# Patient Record
Sex: Male | Born: 1970 | Race: White | Hispanic: No | Marital: Single | Smoking: Current every day smoker
Health system: Southern US, Community
[De-identification: ages and names within clinical notes are randomized; demographics above are authoritative.]

## PROBLEM LIST (undated history)

## (undated) HISTORY — PX: HERNIA REPAIR: SHX51

---

## 2007-02-21 ENCOUNTER — Emergency Department (HOSPITAL_COMMUNITY): Admission: EM | Admit: 2007-02-21 | Discharge: 2007-02-21 | Payer: Self-pay | Admitting: Emergency Medicine

## 2011-12-29 ENCOUNTER — Ambulatory Visit: Payer: Self-pay

## 2011-12-29 DIAGNOSIS — K602 Anal fissure, unspecified: Secondary | ICD-10-CM

## 2012-04-05 ENCOUNTER — Ambulatory Visit: Payer: Self-pay | Admitting: Internal Medicine

## 2012-04-05 VITALS — BP 116/84 | HR 98 | Temp 98.7°F | Resp 20 | Ht 69.0 in | Wt 198.0 lb

## 2012-04-05 DIAGNOSIS — J209 Acute bronchitis, unspecified: Secondary | ICD-10-CM

## 2012-04-05 DIAGNOSIS — F17200 Nicotine dependence, unspecified, uncomplicated: Secondary | ICD-10-CM

## 2012-04-05 DIAGNOSIS — Z720 Tobacco use: Secondary | ICD-10-CM | POA: Insufficient documentation

## 2012-04-05 MED ORDER — AZITHROMYCIN 500 MG PO TABS: 500.0000 mg | ORAL_TABLET | Freq: Every day | ORAL | Status: AC

## 2012-04-05 MED ORDER — ALBUTEROL SULFATE (2.5 MG/3ML) 0.083% IN NEBU: 2.5000 mg | INHALATION_SOLUTION | Freq: Four times a day (QID) | RESPIRATORY_TRACT | Status: DC | PRN

## 2012-04-05 MED ORDER — IPRATROPIUM BROMIDE 0.02 % IN SOLN: 0.5000 mg | Freq: Once | RESPIRATORY_TRACT | Status: AC

## 2012-04-05 MED ORDER — ALBUTEROL SULFATE (2.5 MG/3ML) 0.083% IN NEBU: 2.5000 mg | INHALATION_SOLUTION | Freq: Once | RESPIRATORY_TRACT | Status: AC

## 2012-04-05 NOTE — Progress Notes (Signed)
  Subjective:    Patient ID: Xavier Allen, male    DOB: [birth year], 41 y.o.   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  He has a headache and nasal congestion as well.  States he felt feverish last night.  He has no PCP and is generally healthy.  He has a male partner who is pregnant and is with him today.  He smokes about 1 PPD for 20 years.  Denies underlying known lung disease, diabetes, or any chronic illness.  He has no health insurance.    Review of Systems  All other systems reviewed and are negative.  Negative except as noted in HPI     Objective:   Physical Exam  Vitals reviewed. Constitutional: He is oriented to person, place, and time. He appears well-developed and well-nourished.       obese  HENT:  Head: Normocephalic and atraumatic.  Right Ear: External ear normal.  Mouth/Throat: Oropharynx is clear and moist. No oropharyngeal exudate.  Eyes: Conjunctivae are normal.  Neck: Normal range of motion.  Cardiovascular: Normal rate, regular rhythm and normal heart sounds.   Pulmonary/Chest: Effort normal. He has wheezes.       Inspiratory and expiratory wheezes prior to neb tx, after neb tx with albuterol and atrovent his O2 sat is 97 and his lungs have no wheezing or rhonchii  Abdominal: Soft.  Lymphadenopathy:    He has no cervical adenopathy.  Neurological: He is alert and oriented to person, place, and time.  Skin: Skin is warm and dry.  Psychiatric: He has a normal mood and affect. His behavior is normal.       Pt is anxious     After nebulizer treatment with albuterol and atrovent O2 sat up to 97%.     Assessment & Plan:  Bronchitis with Wheezing:  Azithromycin for 5 days and albuterol inhaler every 4 hours.  Ten minute discussion on smoking cessation aids.  Good time for him to stop with baby on the way.  He and wife agree.  RTC in 1-2 weeks for recheck if desires Wellbutrin or Chantix, pt told he  will need a more thorough screening at that time to be eligible to take those meds.  RTC if symptoms do not improve in 2 days or if symptoms worsen.  AVS printed and given pt.

## 2012-04-05 NOTE — Patient Instructions (Signed)
TAke all of your azithromycin for 5 days and use the albuterol inhaler as needed.  Return if your not improving in 2-3 days or if your symptoms worsen.  Consider stopping smoking!! Bronchitis Bronchitis is the body's way of reacting to injury and/or infection (inflammation) of the bronchi. Bronchi are the air tubes that extend from the windpipe into the lungs. If the inflammation becomes severe, it may cause shortness of breath. CAUSES  Inflammation may be caused by:  A virus.   Germs (bacteria).   Dust.   Allergens.   Pollutants and many other irritants.  The cells lining the bronchial tree are covered with tiny hairs (cilia). These constantly beat upward, away from the lungs, toward the mouth. This keeps the lungs free of pollutants. When these cells become too irritated and are unable to do their job, mucus begins to develop. This causes the characteristic cough of bronchitis. The cough clears the lungs when the cilia are unable to do their job. Without either of these protective mechanisms, the mucus would settle in the lungs. Then you would develop pneumonia. Smoking is a common cause of bronchitis and can contribute to pneumonia. Stopping this habit is the single most important thing you can do to help yourself. TREATMENT   Your caregiver may prescribe an antibiotic if the cough is caused by bacteria. Also, medicines that open up your airways make it easier to breathe. Your caregiver may also recommend or prescribe an expectorant. It will loosen the mucus to be coughed up. Only take over-the-counter or prescription medicines for pain, discomfort, or fever as directed by your caregiver.   Removing whatever causes the problem (smoking, for example) is critical to preventing the problem from getting worse.   Cough suppressants may be prescribed for relief of cough symptoms.   Inhaled medicines may be prescribed to help with symptoms now and to help prevent problems from returning.   For  those with recurrent (chronic) bronchitis, there may be a need for steroid medicines.  SEEK IMMEDIATE MEDICAL CARE IF:   During treatment, you develop more pus-like mucus (purulent sputum).   You have a fever.   Your baby is older than 3 months with a rectal temperature of 102 F (38.9 C) or higher.   Your baby is 66 months old or younger with a rectal temperature of 100.4 F (38 C) or higher.   You become progressively more ill.   You have increased difficulty breathing, wheezing, or shortness of breath.  It is necessary to seek immediate medical care if you are elderly or sick from any other disease. MAKE SURE YOU:   Understand these instructions.   Will watch your condition.   Will get help right away if you are not doing well or get worse.  Document Released: 12/16/2005 Document Revised: 12/05/2011 Document Reviewed: 10/25/2008 [facility] Patient Information 2012 [place], [place].

## 2012-05-11 ENCOUNTER — Ambulatory Visit (INDEPENDENT_AMBULATORY_CARE_PROVIDER_SITE_OTHER): Payer: Managed Care, Other (non HMO) | Admitting: Family Medicine

## 2012-05-11 VITALS — BP 105/74 | HR 73 | Temp 97.8°F | Resp 16 | Ht 67.75 in | Wt 194.6 lb

## 2012-05-11 DIAGNOSIS — L439 Lichen planus, unspecified: Secondary | ICD-10-CM

## 2012-05-11 DIAGNOSIS — J45909 Unspecified asthma, uncomplicated: Secondary | ICD-10-CM

## 2012-05-11 DIAGNOSIS — K439 Ventral hernia without obstruction or gangrene: Secondary | ICD-10-CM

## 2012-05-11 LAB — POCT SKIN KOH: Skin KOH, POC: NEGATIVE

## 2012-05-11 MED ORDER — TRIAMCINOLONE ACETONIDE 0.1 % EX CREA: TOPICAL_CREAM | Freq: Two times a day (BID) | CUTANEOUS | Status: AC

## 2012-05-11 MED ORDER — ALBUTEROL SULFATE HFA 108 (90 BASE) MCG/ACT IN AERS: 2.0000 | INHALATION_SPRAY | Freq: Four times a day (QID) | RESPIRATORY_TRACT | Status: DC | PRN

## 2012-05-11 MED ORDER — BENZONATATE 100 MG PO CAPS: 100.0000 mg | ORAL_CAPSULE | Freq: Three times a day (TID) | ORAL | Status: AC | PRN

## 2012-05-11 MED ORDER — AZITHROMYCIN 250 MG PO TABS: ORAL_TABLET | ORAL | Status: AC

## 2012-05-11 MED ORDER — PREDNISONE 20 MG PO TABS: ORAL_TABLET | ORAL | Status: AC

## 2012-05-11 NOTE — Progress Notes (Signed)
Addended by: Cheri Rous E on: 05/11/2012 02:50 PM   Modules accepted: Orders

## 2012-05-11 NOTE — Patient Instructions (Signed)
Use cream on penis for twice daily for 2 weeks. If rash is not doing better by 3 weeks we will send on to a dermatologist.  Is being made to [facility]  [place] [facility]. If you do not hear from [place] on that please contact us back.

## 2012-05-11 NOTE — Progress Notes (Signed)
Subjective: 41 year old white male with history of having a respiratory infection back in early April, was treated with Zithromax and inhaler. He improved. Then he get sick again. It seems to be just beginning worse. He is blowing out purulent mucus from his nose. He has a bad cough. The cough has aggravated his pre-existing ventral hernia. He is now concerned about that also. His third issue is that he has a red area on his penis. He felt like he had been irritated with having intercourse with his partner was dry. It is is continue to persist however and that has been of concern to him. Lesion on the penis is not painful. It seems to go around the pain is a little bit now. It started as just a red streak.  He is working hard on quitting smoking, has a plan in mind. He intends to be quit in 6 weeks. He has a newborn 3 day old baby at home, and rises that he needs to do things to make himself healthy her. Works as a Investment banker, operational.  Objective: No acute distress. TMs are normal. Throat clear. Neck supple without significant nodes. Chest has soft wheezing bilaterally. There is a prolonged expiratory phase. Heart regular without murmurs. Abdomen was soft without masses. Does have a moderate sized ventral hernia which bulges if he coughs or strains at all. There is a rash around the rim of the glans of his penis. This has a chronic, erythematous, shiny, mildly raised appearance to it. One area looks like it had a little excoriation to it or break in the skin, which is now healed up. Skin scraping was done. KOH examination did not reveal any yeast or fungus.  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 Dwain Sarna and associates at surgical surgery for evaluation of hernia. Advise weight loss and regular  exercise.  Triamcinolone cream to penis. If his getting no better we will refer to a dermatologist. He is to give it about 3 weeks and let me know.

## 2012-06-05 ENCOUNTER — Telehealth (INDEPENDENT_AMBULATORY_CARE_PROVIDER_SITE_OTHER): Payer: Self-pay | Admitting: General Surgery

## 2012-06-05 ENCOUNTER — Ambulatory Visit (INDEPENDENT_AMBULATORY_CARE_PROVIDER_SITE_OTHER): Payer: Managed Care, Other (non HMO) | Admitting: General Surgery

## 2012-06-05 NOTE — Telephone Encounter (Signed)
Let patient know that I rescheduled his apt for 6/10 at 9:20 with Dr. Mignon Pine.  I told him he needs to arrive 30 minutes before the apt time so we can get him through registration.

## 2012-06-08 ENCOUNTER — Encounter (INDEPENDENT_AMBULATORY_CARE_PROVIDER_SITE_OTHER): Payer: Self-pay | Admitting: Surgery

## 2012-06-08 ENCOUNTER — Ambulatory Visit (INDEPENDENT_AMBULATORY_CARE_PROVIDER_SITE_OTHER): Payer: Managed Care, Other (non HMO) | Admitting: Surgery

## 2012-06-08 VITALS — BP 128/82 | HR 80 | Temp 97.4°F | Resp 16 | Ht 69.0 in | Wt 196.0 lb

## 2012-06-08 DIAGNOSIS — R1900 Intra-abdominal and pelvic swelling, mass and lump, unspecified site: Secondary | ICD-10-CM

## 2012-06-08 NOTE — Patient Instructions (Signed)
Stop smoking.  You will be scheduled for a CT scan.  Weight loss. Return to clinic once testing done.

## 2012-06-08 NOTE — Progress Notes (Signed)
Patient ID: Xavier Allen, male   DOB: 04-09-1971, 41 y.o.   MRN: [id number]  Chief Complaint  Patient presents with  . Hernia    new pt- eval ventral hernia    HPI Xavier Allen is a 41 y.o. male.   HPIPatient sent at the request of Dr. Alwyn Ren for a  upper abdominal wall bulge. He has had this for a number of years. It is located in the middle between the umbilicus and sternum. The bulge is larger last few months due to increased coughing. He smokes one pack of cigarettes a day has done so for 15 years. He has a chronic cough. He denies any nausea or vomiting. There is some mild discomfort associated with the area but no severe pain.  History reviewed. No pertinent past medical history.  History reviewed. No pertinent past surgical history.  Family History  Problem Relation Age of Onset  . Heart disease Father     Social History History  Substance Use Topics  . Smoking status: Current Everyday Smoker -- 1.0 packs/day for 15 years    Types: Cigarettes  . Smokeless tobacco: Never Used   Comment: wants to try to quit   . Alcohol Use: Yes    No Known Allergies  Current Outpatient Prescriptions  Medication Sig Dispense Refill  . acetaminophen (TYLENOL) 500 MG tablet Take 500 mg by mouth every 6 (six) hours as needed.      Marland Kitchen albuterol (PROVENTIL HFA;VENTOLIN HFA) 108 (90 BASE) MCG/ACT inhaler Inhale 2 puffs into the lungs every 6 (six) hours as needed for wheezing.  1 Inhaler  0  . albuterol (PROVENTIL) (2.5 MG/3ML) 0.083% nebulizer solution Take 3 mLs (2.5 mg total) by nebulization every 6 (six) hours as needed for wheezing.  150 mL  1  . tiotropium (SPIRIVA) 18 MCG inhalation capsule Place 18 mcg into inhaler and inhale daily.      Marland Kitchen triamcinolone cream (KENALOG) 0.1 % Apply topically 2 (two) times daily.  30 g  0   Current Facility-Administered Medications  Medication Dose Route Frequency Provider Last Rate Last Dose  . albuterol (PROVENTIL) (2.5 MG/3ML) 0.083% nebulizer  solution 2.5 mg  2.5 mg Nebulization Once Rickard Patience, PA-C      . ipratropium (ATROVENT) nebulizer solution 0.5 mg  0.5 mg Nebulization Once Rickard Patience, PA-C        Review of Systems Review of Systems  Constitutional: Negative.   HENT: Negative.   Eyes: Negative.   Respiratory: Positive for cough and wheezing.   Cardiovascular: Negative.   Gastrointestinal: Negative.   Genitourinary: Negative.   Musculoskeletal: Negative.   Neurological: Negative.   Hematological: Negative.   Psychiatric/Behavioral: Negative.     Blood pressure 128/82, pulse 80, temperature 97.4 F (36.3 C), temperature source Temporal, resp. rate 16, height 5\' 9"  (1.753 m), weight 196 lb (88.905 kg).  Physical Exam Physical Exam  Constitutional: He is oriented to person, place, and time. He appears well-developed and well-nourished.  HENT:  Head: Normocephalic and atraumatic.  Eyes: EOM are normal. Pupils are equal, round, and reactive to light.  Neck: Normal range of motion. Neck supple.  Cardiovascular: Normal rate and regular rhythm.   Pulmonary/Chest: Effort normal and breath sounds normal. He has no wheezes. He has no rales.  Abdominal: Soft. Bowel sounds are normal.    Musculoskeletal: Normal range of motion.  Neurological: He is alert and oriented to person, place, and time.  Skin: Skin is warm and dry.  Psychiatric: He  has a normal mood and affect. His behavior is normal. Judgment and thought content normal.       Assessment    Abdominal wall bulge  Tobacco abuse  Chronic cough    Plan    Smoking cessation strongly encouraged  CT scan abdomen pelvis to better anatomy to help differentiate rectus diastases from ventral hernia    Return to clinic in 3 weeks.       Satcha Storlie A. 06/08/2012, 10:19 AM

## 2012-06-11 ENCOUNTER — Other Ambulatory Visit: Payer: Self-pay

## 2012-07-03 ENCOUNTER — Encounter (INDEPENDENT_AMBULATORY_CARE_PROVIDER_SITE_OTHER): Payer: Managed Care, Other (non HMO) | Admitting: Surgery

## 2013-11-11 ENCOUNTER — Encounter (INDEPENDENT_AMBULATORY_CARE_PROVIDER_SITE_OTHER): Payer: Managed Care, Other (non HMO) | Admitting: Surgery

## 2014-01-21 DIAGNOSIS — Z1211 Encounter for screening for malignant neoplasm of colon: Secondary | ICD-10-CM | POA: Insufficient documentation

## 2014-04-08 DIAGNOSIS — K469 Unspecified abdominal hernia without obstruction or gangrene: Secondary | ICD-10-CM | POA: Insufficient documentation

## 2014-04-08 DIAGNOSIS — F17210 Nicotine dependence, cigarettes, uncomplicated: Secondary | ICD-10-CM | POA: Insufficient documentation

## 2016-07-15 ENCOUNTER — Other Ambulatory Visit: Payer: Self-pay | Admitting: Internal Medicine

## 2016-07-15 DIAGNOSIS — T1490XA Injury, unspecified, initial encounter: Secondary | ICD-10-CM

## 2020-05-17 ENCOUNTER — Other Ambulatory Visit: Payer: Self-pay

## 2020-05-18 ENCOUNTER — Encounter: Payer: Self-pay | Admitting: Family Medicine

## 2020-05-18 ENCOUNTER — Ambulatory Visit (INDEPENDENT_AMBULATORY_CARE_PROVIDER_SITE_OTHER): Payer: Self-pay

## 2020-05-18 ENCOUNTER — Ambulatory Visit: Payer: Self-pay | Admitting: Family Medicine

## 2020-05-18 VITALS — BP 118/76 | HR 89 | Temp 97.3°F | Ht 68.0 in | Wt 186.6 lb

## 2020-05-18 DIAGNOSIS — M778 Other enthesopathies, not elsewhere classified: Secondary | ICD-10-CM

## 2020-05-18 DIAGNOSIS — R05 Cough: Secondary | ICD-10-CM

## 2020-05-18 DIAGNOSIS — M25551 Pain in right hip: Secondary | ICD-10-CM

## 2020-05-18 DIAGNOSIS — R059 Cough, unspecified: Secondary | ICD-10-CM

## 2020-05-18 DIAGNOSIS — Z720 Tobacco use: Secondary | ICD-10-CM

## 2020-05-18 DIAGNOSIS — F418 Other specified anxiety disorders: Secondary | ICD-10-CM

## 2020-05-18 MED ORDER — BUPROPION HCL 75 MG PO TABS
ORAL_TABLET | ORAL | 1 refills | Status: DC
Start: 2020-05-18 — End: 2022-03-01

## 2020-05-18 NOTE — Patient Instructions (Signed)
Steps to Quit Smoking Smoking tobacco is the leading cause of preventable death. It can affect almost every organ in the body. Smoking puts you and those around you at risk for developing many serious chronic diseases. Quitting smoking can be difficult, but it is one of the best things that you can do for your health. It is never too late to quit. How do I get ready to quit? When you decide to quit smoking, create a plan to help you succeed. Before you quit:  Pick a date to quit. Set a date within the next 2 weeks to give you time to prepare.  Write down the reasons why you are quitting. Keep this list in places where you will see it often.  Tell your family, friends, and co-workers that you are quitting. Support from your loved ones can make quitting easier.  Talk with your health care provider about your options for quitting smoking.  Find out what treatment options are covered by your health insurance.  Identify people, places, things, and activities that make you want to smoke (triggers). Avoid them. What first steps can I take to quit smoking?  Throw away all cigarettes at home, at work, and in your car.  Throw away smoking accessories, such as ashtrays and lighters.  Clean your car. Make sure to empty the ashtray.  Clean your home, including curtains and carpets. What strategies can I use to quit smoking? Talk with your health care provider about combining strategies, such as taking medicines while you are also receiving in-person counseling. Using these two strategies together makes you more likely to succeed in quitting than if you used either strategy on its own.  If you are pregnant or breastfeeding, talk with your health care provider about finding counseling or other support strategies to quit smoking. Do not take medicine to help you quit smoking unless your health care provider tells you to do so. To quit smoking: Quit right away  Quit smoking completely, instead of  gradually reducing how much you smoke over a period of time. Research shows that stopping smoking right away is more successful than gradually quitting.  Attend in-person counseling to help you build problem-solving skills. You are more likely to succeed in quitting if you attend counseling sessions regularly. Even short sessions of 10 minutes can be effective. Take medicine You may take medicines to help you quit smoking. Some medicines require a prescription and some you can purchase over-the-counter. Medicines may have nicotine in them to replace the nicotine in cigarettes. Medicines may:  Help to stop cravings.  Help to relieve withdrawal symptoms. Your health care provider may recommend:  Nicotine patches, gum, or lozenges.  Nicotine inhalers or sprays.  Non-nicotine medicine that is taken by mouth. Find resources Find resources and support systems that can help you to quit smoking and remain smoke-free after you quit. These resources are most helpful when you use them often. They include:  Online chats with a counselor.  Telephone quitlines.  Printed self-help materials.  Support groups or group counseling.  Text messaging programs.  Mobile phone apps or applications. Use apps that can help you stick to your quit plan by providing reminders, tips, and encouragement. There are many free apps for mobile devices as well as websites. Examples include Quit Guide from the CDC and smokefree.gov What things can I do to make it easier to quit?   Reach out to your family and friends for support and encouragement. Call telephone quitlines (1-800-QUIT-NOW), reach   out to support groups, or work with a Social worker for support.  Ask people who smoke to avoid smoking around you.  Avoid places that trigger you to smoke, such as bars, parties, or smoke-break areas at work.  Spend time with people who do not smoke.  Lessen the stress in your life. Stress can be a smoking trigger for some  people. To lessen stress, try: ? Exercising regularly. ? Doing deep-breathing exercises. ? Doing yoga. ? Meditating. ? Performing a body scan. This involves closing your eyes, scanning your body from head to toe, and noticing which parts of your body are particularly tense. Try to relax the muscles in those areas. How will I feel when I quit smoking? Day 1 to 3 weeks Within the first 24 hours of quitting smoking, you may start to feel withdrawal symptoms. These symptoms are usually most noticeable 2-3 days after quitting, but they usually do not last for more than 2-3 weeks. You may experience these symptoms:  Mood swings.  Restlessness, anxiety, or irritability.  Trouble concentrating.  Dizziness.  Strong cravings for sugary foods and nicotine.  Mild weight gain.  Constipation.  Nausea.  Coughing or a sore throat.  Changes in how the medicines that you take for unrelated issues work in your body.  Depression.  Trouble sleeping (insomnia). Week 3 and afterward After the first 2-3 weeks of quitting, you may start to notice more positive results, such as:  Improved sense of smell and taste.  Decreased coughing and sore throat.  Slower heart rate.  Lower blood pressure.  Clearer skin.  The ability to breathe more easily.  Fewer sick days. Quitting smoking can be very challenging. Do not get discouraged if you are not successful the first time. Some people need to make many attempts to quit before they achieve long-term success. Do your best to stick to your quit plan, and talk with your health care provider if you have any questions or concerns. Summary  Smoking tobacco is the leading cause of preventable death. Quitting smoking is one of the best things that you can do for your health.  When you decide to quit smoking, create a plan to help you succeed.  Quit smoking right away, not slowly over a period of time.  When you start quitting, seek help from your  health care provider, family, or friends. This information is not intended to replace advice given to you by your health care provider. Make sure you discuss any questions you have with your health care provider. Document Revised: 09/10/2019 Document Reviewed: 03/06/2019 Elsevier Patient Education  [place].  Mindfulness-Based Stress Reduction Mindfulness-based stress reduction (MBSR) is a program that helps people learn to practice mindfulness. Mindfulness is the practice of intentionally paying attention to the present moment. It can be learned and practiced through techniques such as education, breathing exercises, meditation, and yoga. MBSR includes several mindfulness techniques in one program. MBSR works best when you understand the treatment, are willing to try new things, and can commit to spending time practicing what you learn. MBSR training may include learning about:  How your emotions, thoughts, and reactions affect your body.  New ways to respond to things that cause negative thoughts to start (triggers).  How to notice your thoughts and let go of them.  Practicing awareness of everyday things that you normally do without thinking.  The techniques and goals of different types of meditation. What are the benefits of MBSR? MBSR can have many benefits, which  include helping you to:  Develop self-awareness. This refers to knowing and understanding yourself.  Learn skills and attitudes that help you to participate in your own health care.  Learn new ways to care for yourself.  Be more accepting about how things are, and let things go.  Be less judgmental and approach things with an open mind.  Be patient with yourself and trust yourself more. MBSR has also been shown to:  Reduce negative emotions, such as depression and anxiety.  Improve memory and focus.  Change how you sense and approach pain.  Boost your body's ability to fight infections.  Help you  connect better with other people.  Improve your sense of well-being. Follow these instructions at home:   Find a local in-person or online MBSR program.  Set aside some time regularly for mindfulness practice.  Find a mindfulness practice that works best for you. This may include one or more of the following: ? Meditation. Meditation involves focusing your mind on a certain thought or activity. ? Breathing awareness exercises. These help you to stay present by focusing on your breath. ? Body scan. For this practice, you lie down and pay attention to each part of your body from head to toe. You can identify tension and soreness and intentionally relax parts of your body. ? Yoga. Yoga involves stretching and breathing, and it can improve your ability to move and be flexible. It can also provide an experience of testing your body's limits, which can help you release stress. ? Mindful eating. This way of eating involves focusing on the taste, texture, color, and smell of each bite of food. Because this slows down eating and helps you feel full sooner, it can be an important part of a weight-loss plan.  Find a podcast or recording that provides guidance for breathing awareness, body scan, or meditation exercises. You can listen to these any time when you have a free moment to rest without distractions.  Follow your treatment plan as told by your health care provider. This may include taking regular medicines and making changes to your diet or lifestyle as recommended. How to practice mindfulness To do a basic awareness exercise:  Find a comfortable place to sit.  Pay attention to the present moment. Observe your thoughts, feelings, and surroundings just as they are.  Avoid placing judgment on yourself, your feelings, or your surroundings. Make note of any judgment that comes up, and let it go.  Your mind may wander, and that is okay. Make note of when your thoughts drift, and return your  attention to the present moment. To do basic mindfulness meditation:  Find a comfortable place to sit. This may include a stable chair or a firm floor cushion. ? Sit upright with your back straight. Let your arms fall next to your side with your hands resting on your legs. ? If sitting in a chair, rest your feet flat on the floor. ? If sitting on a cushion, cross your legs in front of you.  Keep your head in a neutral position with your chin dropped slightly. Relax your jaw and rest the tip of your tongue on the roof of your mouth. Drop your gaze to the floor. You can close your eyes if you like.  Breathe normally and pay attention to your breath. Feel the air moving in and out of your nose. Feel your belly expanding and relaxing with each breath.  Your mind may wander, and that is okay. Make note  of when your thoughts drift, and return your attention to your breath.  Avoid placing judgment on yourself, your feelings, or your surroundings. Make note of any judgment or feelings that come up, let them go, and bring your attention back to your breath.  When you are ready, lift your gaze or open your eyes. Pay attention to how your body feels after the meditation. Where to find more information You can find more information about MBSR from:  Your health care provider.  Community-based meditation centers or programs.  Programs offered near you. Summary  Mindfulness-based stress reduction (MBSR) is a program that teaches you how to intentionally pay attention to the present moment. It is used with other treatments to help you cope better with daily stress, emotions, and pain.  MBSR focuses on developing self-awareness, which allows you to respond to life stress without judgment or negative emotions.  MBSR programs may involve learning different mindfulness practices, such as breathing exercises, meditation, yoga, body scan, or mindful eating. Find a mindfulness practice that works best for  you, and set aside time for it on a regular basis. This information is not intended to replace advice given to you by your health care provider. Make sure you discuss any questions you have with your health care provider. Document Revised: 11/28/2017 Document Reviewed: 04/24/2017 Elsevier Patient Education  2020 [employer].  Health Risks of Smoking Smoking cigarettes is very bad for your health. Tobacco smoke has over 200 known poisons in it. It contains the poisonous gases nitrogen oxide and carbon monoxide. There are over 60 chemicals in tobacco smoke that cause cancer. Smoking is difficult to quit because a chemical in tobacco, called nicotine, causes addiction or dependence. When you smoke and inhale, nicotine is absorbed rapidly into the bloodstream through your lungs. Both inhaled and non-inhaled nicotine may be addictive. What are the risks of cigarette smoke? Cigarette smokers have an increased risk of many serious medical problems, including:  Lung cancer.  Lung disease, such as pneumonia, bronchitis, and emphysema.  Chest pain (angina) and heart attack because the heart is not getting enough oxygen.  Heart disease and peripheral blood vessel disease.  High blood pressure (hypertension).  Stroke.  Oral cancer, including cancer of the lip, mouth, or voice box.  Bladder cancer.  Pancreatic cancer.  Cervical cancer.  Pregnancy complications, including premature birth.  Stillbirths and smaller newborn babies, birth defects, and genetic damage to sperm.  Early menopause.  Lower estrogen level for women.  Infertility.  Facial wrinkles.  Blindness.  Increased risk of broken bones (fractures).  Senile dementia.  Stomach ulcers and internal bleeding.  Delayed wound healing and increased risk of complications during surgery.  Even smoking lightly shortens your life expectancy by several years. Because of secondhand smoke exposure, children of smokers have an  increased risk of the following:  Sudden infant death syndrome (SIDS).  Respiratory infections.  Lung cancer.  Heart disease.  Ear infections. What are the benefits of quitting? There are many health benefits of quitting smoking. Here are some of them:  Within days of quitting smoking, your risk of having a heart attack decreases, your blood flow improves, and your lung capacity improves. Blood pressure, pulse rate, and breathing patterns start returning to normal soon after quitting.  Within months, your lungs may clear up completely.  Quitting for 10 years reduces your risk of developing lung cancer and heart disease to almost that of a nonsmoker.  People who quit may see an improvement in  their overall quality of life. How do I quit smoking?     Smoking is an addiction with both physical and psychological effects, and longtime habits can be hard to change. Your health care provider can recommend:  Programs and community resources, which may include group support, education, or talk therapy.  Prescription medicines to help reduce cravings.  Nicotine replacement products, such as patches, gum, and nasal sprays. Use these products only as directed. Do not replace cigarette smoking with electronic cigarettes, which are commonly called e-cigarettes. The safety of e-cigarettes is not known, and some may contain harmful chemicals.  A combination of two or more of these methods. Where to find more information  American Lung Association: www.lung.org  American Cancer Society: www.cancer.org Summary  Smoking cigarettes is very bad for your health. Cigarette smokers have an increased risk of many serious medical problems, including several cancers, heart disease, and stroke.  Smoking is an addiction with both physical and psychological effects, and longtime habits can be hard to change.  By stopping right away, you can greatly reduce the risk of medical problems for you and your  family.  To help you quit smoking, your health care provider can recommend programs, community resources, prescription medicines, and nicotine replacement products such as patches, gum, and nasal sprays. This information is not intended to replace advice given to you by your health care provider. Make sure you discuss any questions you have with your health care provider. Document Revised: 03/19/2018 Document Reviewed: 12/20/2016 Elsevier Patient Education  2020 [employer].

## 2020-05-18 NOTE — Progress Notes (Signed)
New Patient Office Visit  Subjective:  Patient ID: Xavier Allen, male    DOB: [birth year]  Age: 49 y.o. MRN: [id number]  CC:  Chief Complaint  Patient presents with  . Establish Care    New patient, pain in left palm of hand x 6 months, hip pain pt would like to discuss quitting smoking    HPI Xavier Allen presents for evaluation treatment of multiple medical issues and problems.  He has had a dry cough without fevers chills or significant weight loss that is ongoing for him.  He has no history of asthma.  He is a smoker.  He has smoked roughly 30 years averaging about a pack a day.  He has been having pain in the palm along one of the tendons of his fingers in his left hand.  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  Is also to have a cough for.  Denies sticking with the finger.  There is been no known injury.  Complains of pain in his right hip.  Pain is in the groin area and sometimes hurts him when he walks.  There is pain sometimes with abduction of his leg.  No injury here.  History reviewed. No pertinent past medical history.  Past Surgical History:  Procedure Laterality Date  . HERNIA REPAIR      Family History  Problem Relation Age of Onset  . Stroke Mother   . Diabetes Mother   . Heart disease Father   . Stroke Father     Social History   Socioeconomic History  . Marital status: Single    Spouse name: Not on file  . Number of children: Not on file  . Years of education: Not on file  . Highest education level: Not on file  Occupational History  . Not on file  Tobacco Use  . Smoking status: Current Every Day Smoker    Packs/day: 1.00    Years: 15.00    Pack years: 15.00    Types: Cigarettes  . Smokeless tobacco: Never Used  . Tobacco comment: wants to try to quit   Substance and Sexual Activity  . Alcohol use: Yes    Comment: drinks all not everyday  . Drug use: No  . Sexual activity: Yes  Other Topics Concern  .  Not on file  Social History Narrative  . Not on file   Social Determinants of Health   Financial Resource Strain:   . Difficulty of Paying Living Expenses:   Food Insecurity:   . Worried About Charity fundraiser in the Last Year:   . Arboriculturist in the Last Year:   Transportation Needs:   . Film/video editor (Medical):   Marland Kitchen Lack of Transportation (Non-Medical):   Physical Activity:   . Days of Exercise per Week:   . Minutes of Exercise per Session:   Stress:   . Feeling of Stress :   Social Connections:   . Frequency of Communication with Friends and Family:   . Frequency of Social Gatherings with Friends and Family:   . Attends Religious Services:   . Active Member of Clubs or Organizations:   . Attends Archivist Meetings:   Marland Kitchen Marital Status:   Intimate Partner Violence:   . Fear of Current or Ex-Partner:   . Emotionally Abused:   Marland Kitchen Physically Abused:   . Sexually Abused:     ROS Review of Systems  Constitutional: Negative for chills, diaphoresis, fatigue, fever and unexpected weight change.  HENT: Negative.   Eyes: Negative for photophobia and visual disturbance.  Respiratory: Positive for cough. Negative for choking, chest tightness, shortness of breath and wheezing.   Cardiovascular: Negative.   Gastrointestinal: Negative.   Endocrine: Negative for polyphagia and polyuria.  Genitourinary: Negative.   Musculoskeletal: Positive for arthralgias. Negative for back pain and gait problem.  Skin: Negative for pallor and rash.  Allergic/Immunologic: Negative for immunocompromised state.  Neurological: Negative for weakness.  Hematological: Does not bruise/bleed easily.  Psychiatric/Behavioral: Positive for decreased concentration and dysphoric mood. The patient is nervous/anxious.     Objective:   Today's Vitals: BP 118/76   Pulse 89   Temp (!) 97.3 F (36.3 C) (Tympanic)   Ht 5\' 8"  (1.727 m)   Wt 186 lb 9.6 oz (84.6 kg)   SpO2 96%   BMI  28.37 kg/m   Physical Exam Vitals and nursing note reviewed.  Constitutional:      General: He is not in acute distress.    Appearance: Normal appearance. He is normal weight. He is not ill-appearing, toxic-appearing or diaphoretic.  HENT:     Head: Normocephalic and atraumatic.     Right Ear: External ear normal.     Left Ear: External ear normal.  Eyes:     General: No scleral icterus.       Right eye: No discharge.        Left eye: No discharge.     Conjunctiva/sclera: Conjunctivae normal.     Pupils: Pupils are equal, round, and reactive to light.  Cardiovascular:     Rate and Rhythm: Normal rate and regular rhythm.  Pulmonary:     Effort: Pulmonary effort is normal. No respiratory distress.     Breath sounds: Normal breath sounds. No stridor. No wheezing, rhonchi or rales.  Abdominal:     General: Bowel sounds are normal. There is no distension.     Palpations: There is no mass.     Tenderness: There is no abdominal tenderness. There is no guarding or rebound.     Hernia: No hernia is present.  Musculoskeletal:     Left hand: Tenderness present. No swelling, deformity or lacerations. Normal range of motion. Normal strength.       Arms:     Cervical back: No rigidity or tenderness.     Lumbar back: No swelling, deformity, lacerations, spasms, tenderness or bony tenderness. Normal range of motion.     Right hip: No tenderness or bony tenderness. Normal range of motion. Normal strength.     Left hip: No tenderness or bony tenderness. Normal range of motion. Normal strength.  Lymphadenopathy:     Cervical: No cervical adenopathy.  Neurological:     Mental Status: He is alert and oriented to person, place, and time.     Motor: Motor function is intact. No weakness.  Psychiatric:        Mood and Affect: Mood normal.        Behavior: Behavior normal.     Assessment & Plan:   Problem List Items Addressed This Visit    None    Visit Diagnoses    Flexor tendinitis of  hand    -  Primary   Relevant Orders   Ambulatory referral to Sports Medicine   Hip pain, right       Relevant Orders   Ambulatory referral to Sports Medicine   DG Hip Unilat W OR W/O  Pelvis 2-3 Views Right   Tobacco abuse       Relevant Medications   buPROPion (WELLBUTRIN) 75 MG tablet   Depression with anxiety       Relevant Medications   buPROPion (WELLBUTRIN) 75 MG tablet   Cough       Relevant Orders   DG Chest 2 View      Outpatient Encounter Medications as of 05/18/2020  Medication Sig  . acetaminophen (TYLENOL) 500 MG tablet Take 500 mg by mouth every 6 (six) hours as needed.  Marland Kitchen albuterol (PROVENTIL HFA;VENTOLIN HFA) 108 (90 BASE) MCG/ACT inhaler Inhale 2 puffs into the lungs every 6 (six) hours as needed for wheezing. (Patient not taking: Reported on 05/18/2020)  . albuterol (PROVENTIL) (2.5 MG/3ML) 0.083% nebulizer solution Take 3 mLs (2.5 mg total) by nebulization every 6 (six) hours as needed for wheezing.  Marland Kitchen buPROPion (WELLBUTRIN) 75 MG tablet Take each morning for one week and then increase twice daily.  Marland Kitchen tiotropium (SPIRIVA) 18 MCG inhalation capsule Place 18 mcg into inhaler and inhale daily.   Facility-Administered Encounter Medications as of 05/18/2020  Medication  . albuterol (PROVENTIL) (2.5 MG/3ML) 0.083% nebulizer solution 2.5 mg  . ipratropium (ATROVENT) nebulizer solution 0.5 mg    Follow-up: Return in about 5 weeks (around 06/22/2020).   Patient was given information on steps to quit smoking as well as mindfulness based stress reduction.  We will start Wellbutrin.  Hopefully this will help his depression and anxiety as well as help him quit smoking.  It is worked in the past for him.  Sports medicine referral for evaluation of right hip pain and possible ganglion cyst.  Advised to quit smoking.  Physical exam in the near future. Mliss Sax, MD

## 2020-05-22 ENCOUNTER — Ambulatory Visit: Payer: Self-pay | Admitting: Family Medicine

## 2020-05-22 NOTE — Progress Notes (Deleted)
  Xavier Allen - 49 y.o. male MRN [id number]  Date of birth: [birth year]  SUBJECTIVE:  Including CC & ROS.  No chief complaint on file.   Xavier Allen is a 49 y.o. male that is  ***.  ***   Review of Systems See HPI   HISTORY: Past Medical, Surgical, Social, and Family History Reviewed & Updated per EMR.   Pertinent Historical Findings include:  No past medical history on file.  Past Surgical History:  Procedure Laterality Date  . HERNIA REPAIR      Family History  Problem Relation Age of Onset  . Stroke Mother   . Diabetes Mother   . Heart disease Father   . Stroke Father     Social History   Socioeconomic History  . Marital status: Single    Spouse name: Not on file  . Number of children: Not on file  . Years of education: Not on file  . Highest education level: Not on file  Occupational History  . Not on file  Tobacco Use  . Smoking status: Current Every Day Smoker    Packs/day: 1.00    Years: 15.00    Pack years: 15.00    Types: Cigarettes  . Smokeless tobacco: Never Used  . Tobacco comment: wants to try to quit   Substance and Sexual Activity  . Alcohol use: Yes    Comment: drinks all not everyday  . Drug use: No  . Sexual activity: Yes  Other Topics Concern  . Not on file  Social History Narrative  . Not on file   Social Determinants of Health   Financial Resource Strain:   . Difficulty of Paying Living Expenses:   Food Insecurity:   . Worried About Programme researcher, broadcasting/film/video in the Last Year:   . Barista in the Last Year:   Transportation Needs:   . Freight forwarder (Medical):   Marland Kitchen Lack of Transportation (Non-Medical):   Physical Activity:   . Days of Exercise per Week:   . Minutes of Exercise per Session:   Stress:   . Feeling of Stress :   Social Connections:   . Frequency of Communication with Friends and Family:   . Frequency of Social Gatherings with Friends and Family:   . Attends Religious Services:   . Active Member  of Clubs or Organizations:   . Attends Banker Meetings:   Marland Kitchen Marital Status:   Intimate Partner Violence:   . Fear of Current or Ex-Partner:   . Emotionally Abused:   Marland Kitchen Physically Abused:   . Sexually Abused:      PHYSICAL EXAM:  VS: There were no vitals taken for this visit. Physical Exam Gen: NAD, alert, cooperative with exam, well-appearing MSK:  ***      ASSESSMENT & PLAN:   No problem-specific Assessment & Plan notes found for this encounter.

## 2020-05-31 ENCOUNTER — Telehealth: Payer: Self-pay

## 2020-05-31 NOTE — Telephone Encounter (Signed)
Patient is needing the number to call to schedule with Dr. Hulen Shouts is aware he will get a call back with this information/plz advise/thx dmf

## 2020-06-01 NOTE — Telephone Encounter (Signed)
Number left on patients VM

## 2020-10-26 IMAGING — DX DG HIP (WITH OR WITHOUT PELVIS) 2-3V*R*
2 series · 2 of 2 positions shown · non-contrast
Comparison: No prior.

CLINICAL DATA: Right hip pain.

EXAM:
DG HIP (WITH OR WITHOUT PELVIS) 2-3V RIGHT

[pelvis ap]
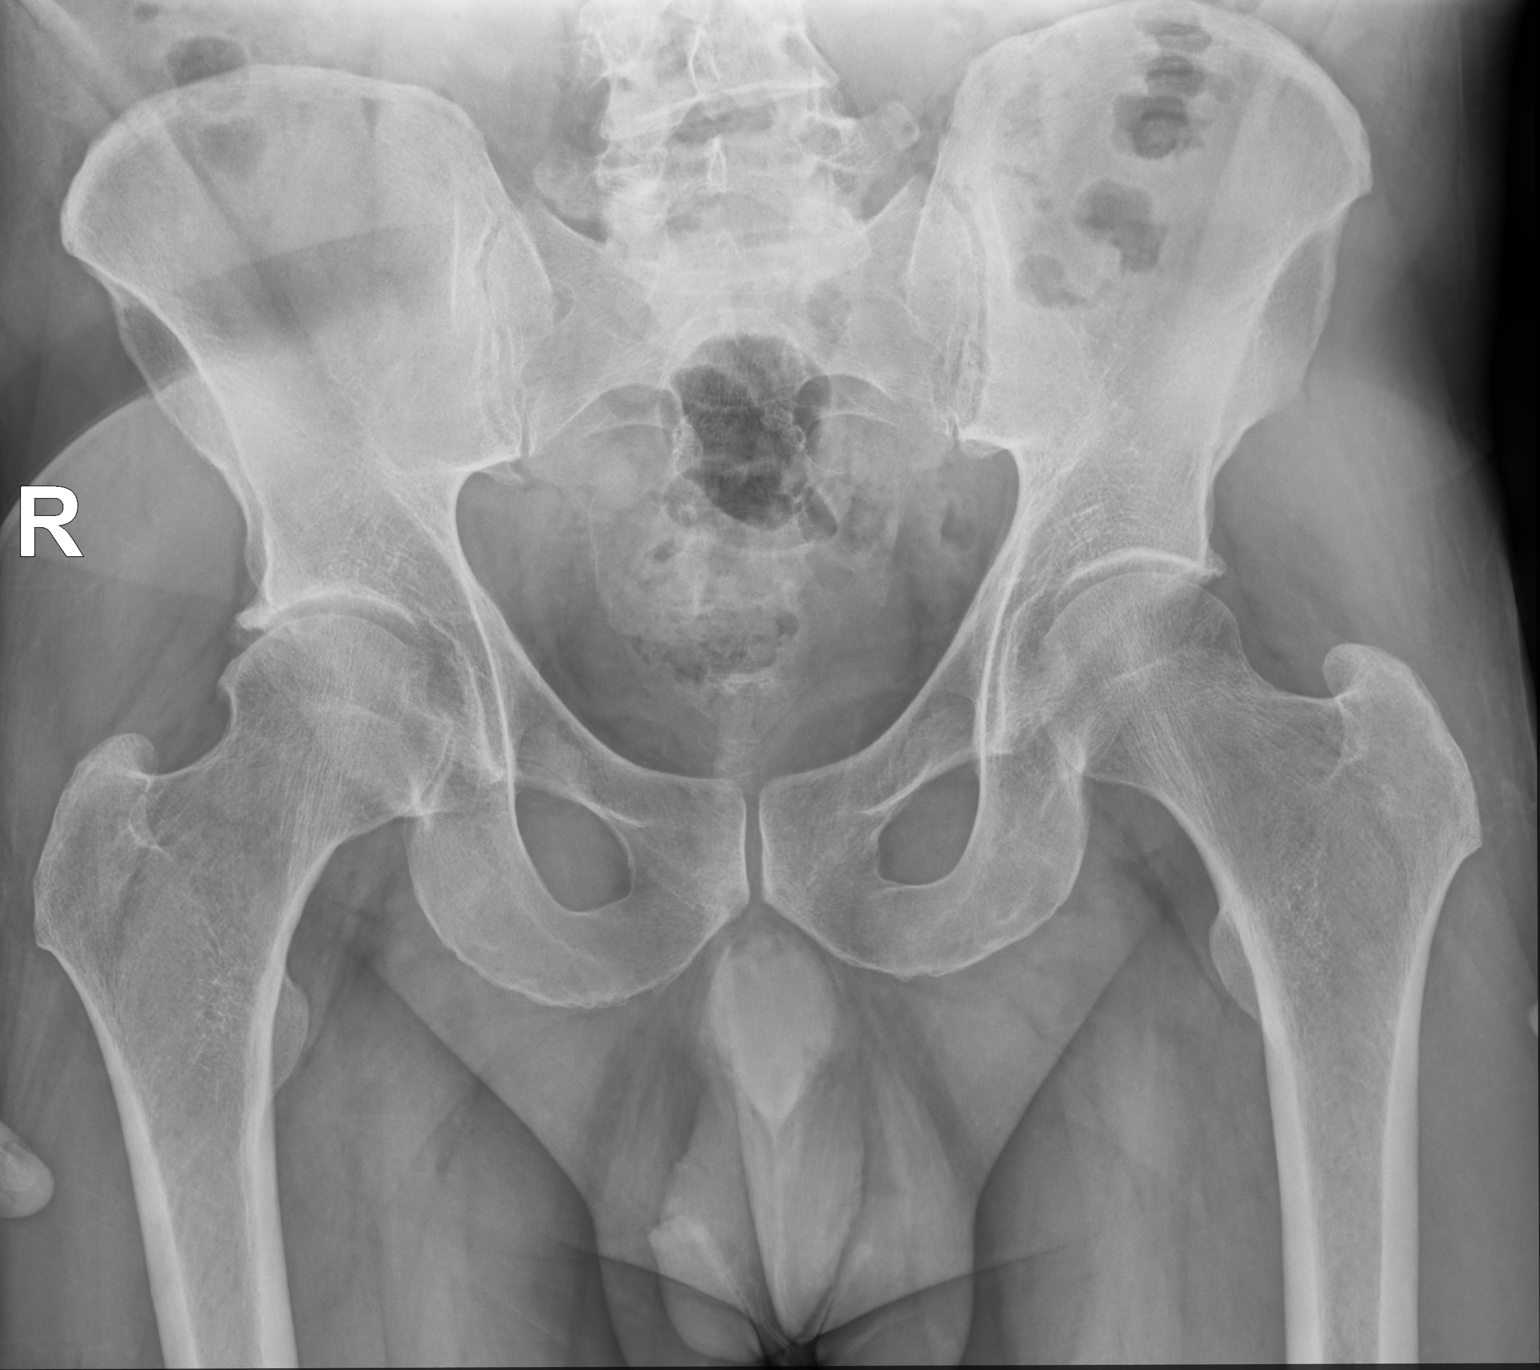

[hip frog leg lat]
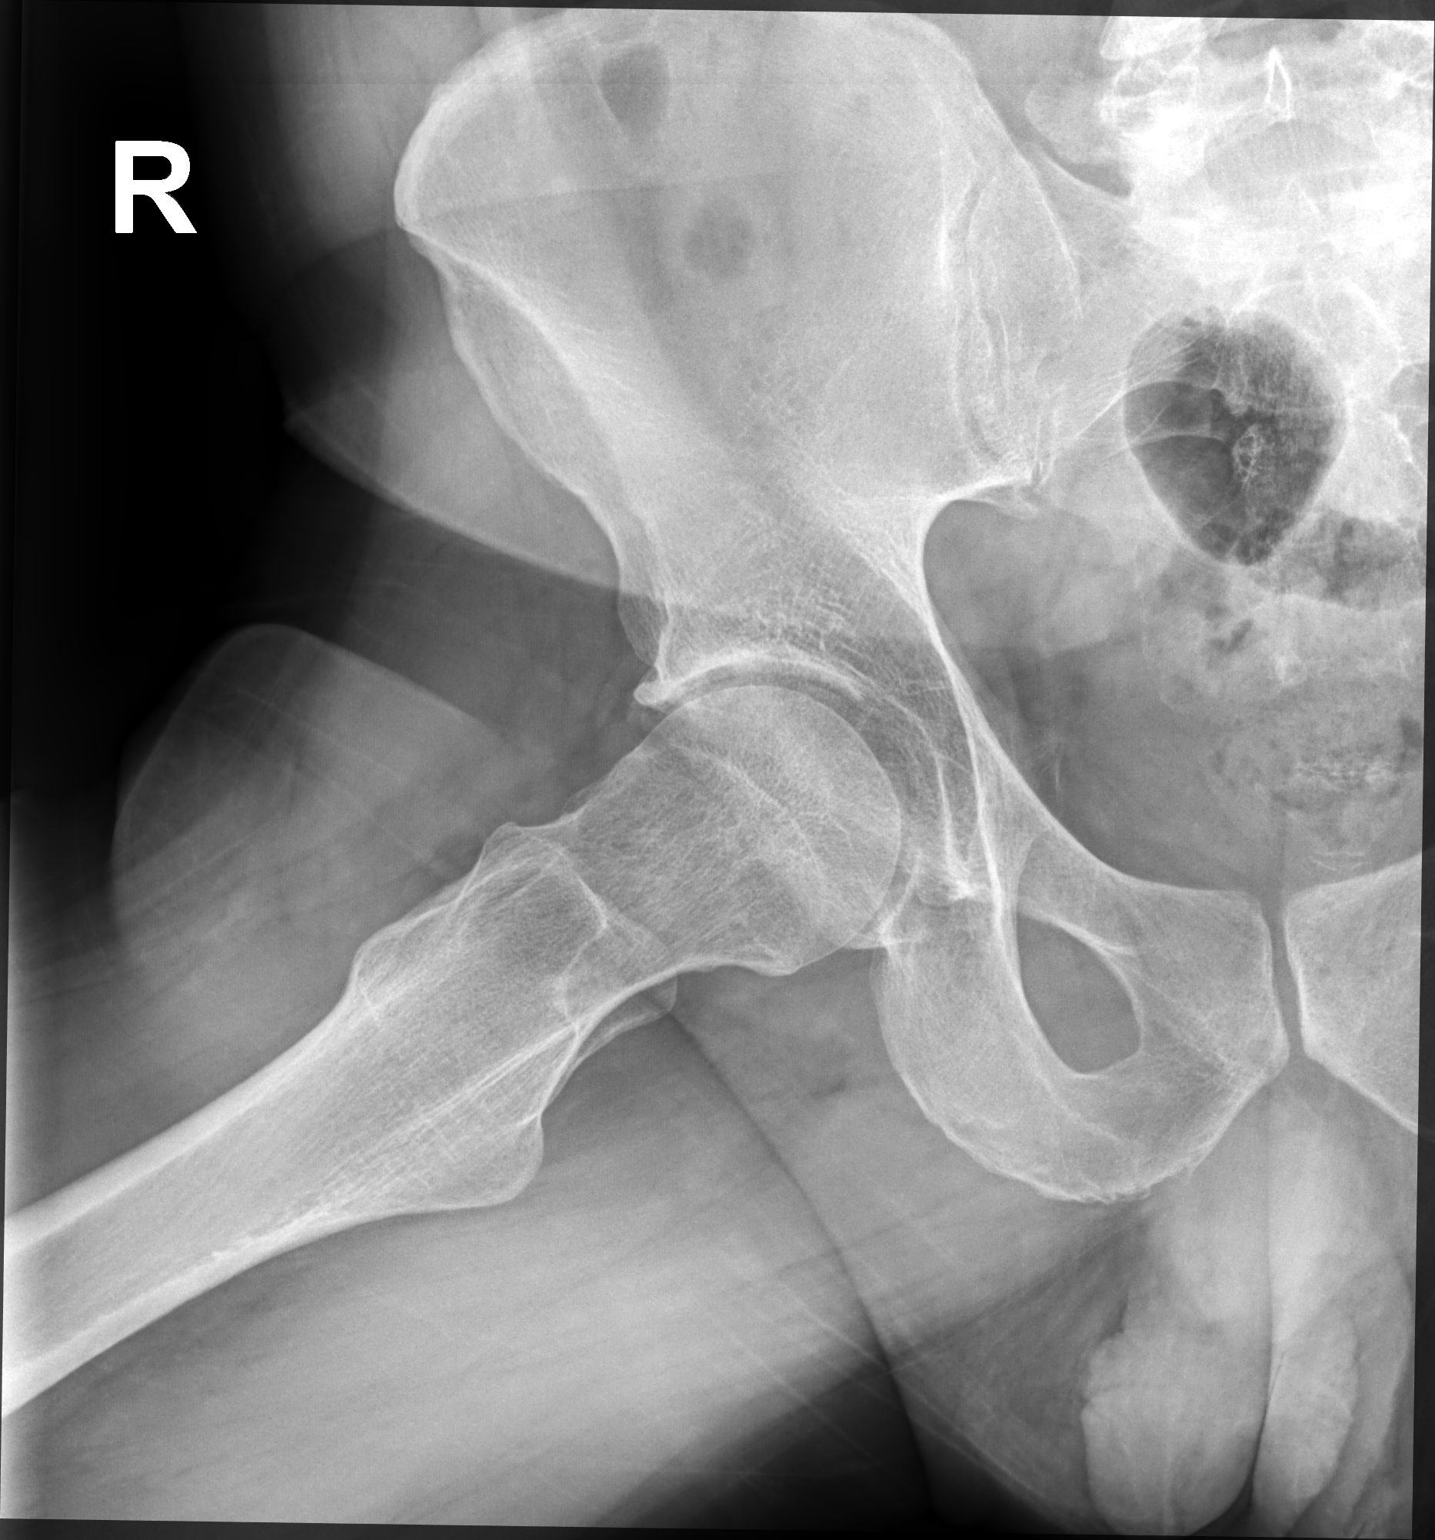

[2 of 2 positions shown; findings below may reference images not displayed]

FINDINGS: Degenerative changes lumbar spine and both hips. No acute bony or
joint abnormality. No evidence of fracture dislocation.
IMPRESSION: Degenerative changes lumbar spine and both hips. No acute bony
abnormality identified.

## 2022-03-01 ENCOUNTER — Ambulatory Visit (INDEPENDENT_AMBULATORY_CARE_PROVIDER_SITE_OTHER): Payer: Self-pay | Admitting: Family Medicine

## 2022-03-01 ENCOUNTER — Other Ambulatory Visit: Payer: Self-pay

## 2022-03-01 ENCOUNTER — Encounter: Payer: Self-pay | Admitting: Family Medicine

## 2022-03-01 VITALS — BP 122/74 | HR 96 | Temp 97.6°F | Ht 68.0 in | Wt 189.0 lb

## 2022-03-01 DIAGNOSIS — Z1211 Encounter for screening for malignant neoplasm of colon: Secondary | ICD-10-CM

## 2022-03-01 DIAGNOSIS — Z720 Tobacco use: Secondary | ICD-10-CM

## 2022-03-01 DIAGNOSIS — J22 Unspecified acute lower respiratory infection: Secondary | ICD-10-CM | POA: Insufficient documentation

## 2022-03-01 DIAGNOSIS — F418 Other specified anxiety disorders: Secondary | ICD-10-CM | POA: Insufficient documentation

## 2022-03-01 MED ORDER — BENZONATATE 200 MG PO CAPS: 200.0000 mg | ORAL_CAPSULE | Freq: Two times a day (BID) | ORAL | 0 refills | Status: AC | PRN

## 2022-03-01 MED ORDER — DOXYCYCLINE HYCLATE 100 MG PO TABS: 100.0000 mg | ORAL_TABLET | Freq: Two times a day (BID) | ORAL | 0 refills | Status: AC

## 2022-03-01 MED ORDER — BUPROPION HCL ER (XL) 150 MG PO TB24: 150.0000 mg | ORAL_TABLET | Freq: Every day | ORAL | 2 refills | Status: DC

## 2022-03-01 NOTE — Progress Notes (Signed)
On today Mr. Vertell Limber going to get the blood try cast you know the COVID-19 flu and RSV ? ?Established Patient Office Visit ? ?Subjective:  ?Patient ID: Xavier Allen, male    DOB: 06-11-1971  Age: 51 y.o. MRN: [id number] ? ?CC:  ?Chief Complaint  ?Patient presents with  ? Cough  ?  Cough x 1 week would like a refill on Wellbutrin.   ? ? ?HPI ?Feliciana Rossetti presents for 1 week history of cough productive of yellow phlegm.  Denies wheezing or tightness in his chest.  There have been no fevers and chills.  Denies sore throat.  Denies nasal congestion postnasal drip.  Denies mild myalgias or arthralgias.  Bupropion was helpful.  He did change the taste of his cigarettes.  He was able to stop drinking without much of a problem.  Continues to smoke.  Bupropion also helped him to focus.  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  He is busy and stressed. ? ?No past medical history on file. ? ?Past Surgical History:  ?Procedure Laterality Date  ? HERNIA REPAIR    ? ? ?Family History  ?Problem Relation Age of Onset  ? Stroke Mother   ? Diabetes Mother   ? Heart disease Father   ? Stroke Father   ? ? ?Social History  ? ?Socioeconomic History  ? Marital status: Single  ?  Spouse name: Not on file  ? Number of children: Not on file  ? Years of education: Not on file  ? Highest education level: Not on file  ?Occupational History  ? Not on file  ?Tobacco Use  ? Smoking status: Every Day  ?  Packs/day: 1.00  ?  Years: 15.00  ?  Pack years: 15.00  ?  Types: Cigarettes  ? Smokeless tobacco: Never  ? Tobacco comments:  ?  wants to try to quit   ?Vaping Use  ? Vaping Use: Never used  ?Substance and Sexual Activity  ? Alcohol use: Yes  ?  Comment: drinks all not everyday  ? Drug use: No  ? Sexual activity: Yes  ?Other Topics Concern  ? Not on file  ?Social History Narrative  ? Not on file  ? ?Social Determinants of Health  ? ?Financial Resource Strain: Not on file  ?Food Insecurity: Not on file  ?Transportation Needs: Not on file   ?Physical Activity: Not on file  ?Stress: Not on file  ?Social Connections: Not on file  ?Intimate Partner Violence: Not on file  ? ? ?Outpatient Medications Prior to Visit  ?Medication Sig Dispense Refill  ? tiotropium (SPIRIVA) 18 MCG inhalation capsule Place 18 mcg into inhaler and inhale daily.    ? acetaminophen (TYLENOL) 500 MG tablet Take 500 mg by mouth every 6 (six) hours as needed. (Patient not taking: Reported on 03/01/2022)    ? albuterol (PROVENTIL HFA;VENTOLIN HFA) 108 (90 BASE) MCG/ACT inhaler Inhale 2 puffs into the lungs every 6 (six) hours as needed for wheezing. (Patient not taking: Reported on 05/18/2020) 1 Inhaler 0  ? albuterol (PROVENTIL) (2.5 MG/3ML) 0.083% nebulizer solution Take 3 mLs (2.5 mg total) by nebulization every 6 (six) hours as needed for wheezing. 150 mL 1  ? buPROPion (WELLBUTRIN) 75 MG tablet Take each morning for one week and then increase twice daily. (Patient not taking: Reported on 03/01/2022) 60 tablet 1  ? ?Facility-Administered Medications Prior to Visit  ?Medication Dose Route Frequency Provider Last Rate Last Admin  ? albuterol (PROVENTIL) (2.5 MG/3ML) 0.083% nebulizer  solution 2.5 mg  2.5 mg Nebulization Once Kemper Durie, PA-C      ? ipratropium (ATROVENT) nebulizer solution 0.5 mg  0.5 mg Nebulization Once Kemper Durie, PA-C      ? ? ?No Known Allergies ? ?ROS ?Review of Systems  ?Constitutional:  Negative for chills, diaphoresis, fatigue, fever and unexpected weight change.  ?HENT:  Positive for congestion. Negative for postnasal drip, rhinorrhea and sore throat.   ?Eyes:  Negative for photophobia and visual disturbance.  ?Respiratory:  Positive for cough. Negative for chest tightness, shortness of breath and wheezing.   ?Cardiovascular: Negative.   ?Gastrointestinal:  Negative for nausea and vomiting.  ?Musculoskeletal:  Negative for arthralgias and myalgias.  ?Neurological:  Negative for speech difficulty, weakness and headaches.  ?Hematological:  Does not  bruise/bleed easily.  ?Psychiatric/Behavioral: Negative.    ? ?  ?Objective:  ?  ?Physical Exam ?Vitals and nursing note reviewed.  ?Constitutional:   ?   General: He is not in acute distress. ?   Appearance: Normal appearance. He is not ill-appearing, toxic-appearing or diaphoretic.  ?HENT:  ?   Head: Normocephalic and atraumatic.  ?   Right Ear: External ear normal.  ?   Left Ear: External ear normal.  ?   Mouth/Throat:  ?   Mouth: Mucous membranes are moist.  ?   Pharynx: Oropharynx is clear. No oropharyngeal exudate or posterior oropharyngeal erythema.  ?Eyes:  ?   General: No scleral icterus.    ?   Right eye: No discharge.     ?   Left eye: No discharge.  ?   Extraocular Movements: Extraocular movements intact.  ?   Conjunctiva/sclera: Conjunctivae normal.  ?   Pupils: Pupils are equal, round, and reactive to light.  ?Cardiovascular:  ?   Rate and Rhythm: Normal rate and regular rhythm.  ?Pulmonary:  ?   Effort: Pulmonary effort is normal. No respiratory distress.  ?   Breath sounds: Normal breath sounds. No wheezing, rhonchi or rales.  ?Abdominal:  ?   General: Abdomen is flat.  ?   Palpations: Abdomen is soft.  ?Musculoskeletal:  ?   Cervical back: No rigidity or tenderness.  ?Lymphadenopathy:  ?   Cervical: No cervical adenopathy.  ?Skin: ?   General: Skin is warm and dry.  ?Neurological:  ?   Mental Status: He is alert and oriented to person, place, and time.  ?Psychiatric:     ?   Mood and Affect: Mood normal.     ?   Behavior: Behavior normal.  ? ? ?BP 122/74 (BP Location: Right Leg, Patient Position: Sitting, Cuff Size: Large)   Pulse 96   Temp 97.6 ?F (36.4 ?C) (Temporal)   Ht 5' 8"  (1.727 m)   Wt 189 lb (85.7 kg)   SpO2 97%   BMI 28.74 kg/m?  ?Wt Readings from Last 3 Encounters:  ?03/01/22 189 lb (85.7 kg)  ?05/18/20 186 lb 9.6 oz (84.6 kg)  ?06/08/12 196 lb (88.9 kg)  ? ? ? ?Health Maintenance Due  ?Topic Date Due  ? HIV Screening  Never done  ? Hepatitis C Screening  Never done  ? COLONOSCOPY  (Pts 45-70yr Insurance coverage will need to be confirmed)  Never done  ? ? ?There are no preventive care reminders to display for this patient. ? ?No results found for: TSH ?No results found for: WBC, HGB, HCT, MCV, PLT ?No results found for: NA, K, CHLORIDE, CO2, GLUCOSE, BUN, CREATININE, BILITOT, ALKPHOS, AST,  ALT, PROT, ALBUMIN, CALCIUM, ANIONGAP, EGFR, GFR ?No results found for: CHOL ?No results found for: HDL ?No results found for: [place] ?No results found for: TRIG ?No results found for: CHOLHDL ?No results found for: HGBA1C ? ?  ?Assessment & Plan:  ? ?Problem List Items Addressed This Visit   ? ?  ? Respiratory  ? Lower respiratory tract infection  ? Relevant Medications  ? doxycycline (VIBRA-TABS) 100 MG tablet  ? benzonatate (TESSALON) 200 MG capsule  ? Other Relevant Orders  ? COVID-19, Flu A+B and RSV  ?  ? Other  ? Tobacco abuse  ? Relevant Medications  ? buPROPion (WELLBUTRIN XL) 150 MG 24 hr tablet  ? Screen for colon cancer - Primary  ? Relevant Orders  ? Ambulatory referral to Gastroenterology  ? Depression with anxiety  ? Relevant Medications  ? buPROPion (WELLBUTRIN XL) 150 MG 24 hr tablet  ? ? ?Meds ordered this encounter  ?Medications  ? buPROPion (WELLBUTRIN XL) 150 MG 24 hr tablet  ?  Sig: Take 1 tablet (150 mg total) by mouth daily.  ?  Dispense:  30 tablet  ?  Refill:  2  ? doxycycline (VIBRA-TABS) 100 MG tablet  ?  Sig: Take 1 tablet (100 mg total) by mouth 2 (two) times daily for 10 days.  ?  Dispense:  20 tablet  ?  Refill:  0  ? benzonatate (TESSALON) 200 MG capsule  ?  Sig: Take 1 capsule (200 mg total) by mouth 2 (two) times daily as needed for cough.  ?  Dispense:  20 capsule  ?  Refill:  0  ? ? ?Follow-up: Return if symptoms worsen or fail to improve, for Return in 2 to 3 months for physical exam..  ? ? ?Libby Maw, MD ?

## 2022-03-03 LAB — COVID-19, FLU A+B AND RSV
Influenza A, NAA: NOT DETECTED
Influenza B, NAA: NOT DETECTED
RSV, NAA: NOT DETECTED
SARS-CoV-2, NAA: NOT DETECTED

## 2023-03-10 ENCOUNTER — Other Ambulatory Visit: Payer: Self-pay | Admitting: Family Medicine

## 2023-03-10 DIAGNOSIS — Z720 Tobacco use: Secondary | ICD-10-CM

## 2023-03-10 DIAGNOSIS — F418 Other specified anxiety disorders: Secondary | ICD-10-CM

## 2023-03-11 NOTE — Telephone Encounter (Signed)
Called pt to get scheduled for physical. Unable to LVM. VM full. Will try again at a later time.

## 2023-04-16 ENCOUNTER — Encounter: Payer: Self-pay | Admitting: *Deleted

## 2023-06-27 ENCOUNTER — Other Ambulatory Visit: Payer: Self-pay | Admitting: Family Medicine

## 2023-06-27 DIAGNOSIS — F418 Other specified anxiety disorders: Secondary | ICD-10-CM

## 2023-06-27 DIAGNOSIS — Z720 Tobacco use: Secondary | ICD-10-CM

## 2024-10-04 ENCOUNTER — Other Ambulatory Visit: Payer: Self-pay | Admitting: Family Medicine

## 2024-10-04 DIAGNOSIS — Z720 Tobacco use: Secondary | ICD-10-CM

## 2024-10-04 DIAGNOSIS — F418 Other specified anxiety disorders: Secondary | ICD-10-CM

## 2024-11-30 ENCOUNTER — Telehealth: Payer: Self-pay | Admitting: Family Medicine

## 2024-11-30 NOTE — Telephone Encounter (Signed)
 Prescription Request  11/30/2024  LOV: Visit date not found  What is the name of the medication or equipment? buPROPion  (WELLBUTRIN  XL) 150 MG 24 hr tablet [[id number]]   Have you contacted your pharmacy to request a refill? No   Which pharmacy would you like this sent to?  [id number] - [place], [place] - [address] [facility] [address] [place] [address] Phone: [phone] Fax: [phone]    Patient notified that their request is being sent to the clinical staff for review and that they should receive a response within 2 business days.   Please advise at Mobile [phone] (mobile)

## 2024-12-06 ENCOUNTER — Telehealth: Payer: Self-pay

## 2024-12-06 ENCOUNTER — Ambulatory Visit: Payer: Self-pay | Admitting: Family Medicine

## 2024-12-06 NOTE — Telephone Encounter (Signed)
 Copied from CRM #[id number]. Topic: Appointments - Transfer of Care >> Dec 06, 2024  9:50 AM Anairis L wrote: Pt is requesting to transfer FROM: kremer Pt is requesting to transfer TO: Saguier Reason for requested transfer: appointment to far out, claims provider is always out It is the responsibility of the team the patient would like to transfer to (Dr. Saguier) to reach out to the patient if for any reason this transfer is not acceptable.

## 2024-12-07 ENCOUNTER — Telehealth: Payer: Self-pay | Admitting: Medical

## 2024-12-07 ENCOUNTER — Ambulatory Visit: Payer: Self-pay | Admitting: Medical

## 2024-12-07 ENCOUNTER — Ambulatory Visit (HOSPITAL_BASED_OUTPATIENT_CLINIC_OR_DEPARTMENT_OTHER)
Admission: RE | Admit: 2024-12-07 | Discharge: 2024-12-07 | Disposition: A | Payer: Self-pay | Source: Ambulatory Visit | Attending: Medical | Admitting: Medical

## 2024-12-07 VITALS — BP 128/80 | HR 88 | Temp 97.9°F | Resp 15 | Ht 68.0 in | Wt 200.0 lb

## 2024-12-07 DIAGNOSIS — Z720 Tobacco use: Secondary | ICD-10-CM

## 2024-12-07 DIAGNOSIS — R059 Cough, unspecified: Secondary | ICD-10-CM

## 2024-12-07 DIAGNOSIS — R062 Wheezing: Secondary | ICD-10-CM

## 2024-12-07 DIAGNOSIS — K429 Umbilical hernia without obstruction or gangrene: Secondary | ICD-10-CM

## 2024-12-07 DIAGNOSIS — Z87891 Personal history of nicotine dependence: Secondary | ICD-10-CM

## 2024-12-07 MED ORDER — BUPROPION HCL ER (XL) 150 MG PO TB24: 150.0000 mg | ORAL_TABLET | Freq: Every day | ORAL | 2 refills | Status: AC

## 2024-12-07 MED ORDER — TIOTROPIUM BROMIDE 18 MCG IN CAPS: 18.0000 ug | ORAL_CAPSULE | Freq: Every day | RESPIRATORY_TRACT | 2 refills | Status: AC

## 2024-12-07 MED ORDER — ALBUTEROL SULFATE HFA 108 (90 BASE) MCG/ACT IN AERS: 2.0000 | INHALATION_SPRAY | Freq: Four times a day (QID) | RESPIRATORY_TRACT | 0 refills | Status: AC | PRN

## 2024-12-07 NOTE — Patient Instructions (Signed)
 Umbilical hernia Suspected recurrence with bulging. Possible mesh failure. - Referred to general surgery for evaluation. - Advised wearing supportive belt during heavy lifting or exercise.  Chronic cough and wheezing Persistent cough and wheezing since recent illness. Previous azithromycin  and cough medications. Current albuterol  use. No recent chest imaging. - Ordered chest x-ray. - Refilled albuterol  inhaler for use as needed. - Refilled Spiriva inhaler for use as needed.  Nicotine dependence history, cigarettes Recent cessation after 30 years. Motivated by health improvement. Wellbutrin  previously effective. - Prescribed Wellbutrin  XL 150 mg daily with two refills. - Encouraged continued abstinence.  General health maintenance Discussed insurance status and need for CT lung cancer screening due to smoking history. - Advised contacting insurance provider to reinstate coverage. - Plan for CT lung cancer screening once insurance is reinstated. - Plan for wellness exam and fasting labs in one month if insurance is reinstated. - Discuss potential for colonoscopy once insurance is reinstated. or can go ahead and place referral.  Follow up in one month or sooner if needed

## 2024-12-07 NOTE — Telephone Encounter (Signed)
 So did see pt and we talked about transfer. Accepting transfer at all.

## 2024-12-07 NOTE — Progress Notes (Signed)
 Subjective:    Patient ID: Xavier Allen, male    DOB: 1971-07-18, 53 y.o.   MRN: [id number]  HPI  Xavier Allen is a 53 year old male who presents with a persistent cough and recent smoking cessation.  He has had a persistent cough since Thanksgiving. It was initially productive but is now dry and tight, with occasional nocturnal wheezing that wakes him between midnight and 1:30 AM. Prior treatment included a Z-Pak and cough medicine at [facility].  He quit smoking about ten days ago after a 30-year history of smoking at least one pack per day. He wants to stay smoke-free and has used Wellbutrin  in the past to help with cessation.  He has used Spiriva and albuterol  in the past but currently does not have Spiriva and is unsure if he has any albuterol  left.  He had a hernia repair with mesh about ten years ago and recently noticed a bulge at the belly button level when lifting his legs. He is concerned this may represent a recurrent hernia or mesh issue.  His medical insurance lapsed in September 2024, though he retains dental and eye coverage and is working to restore medical coverage. 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        Review of Systems  Constitutional:  Negative for chills, fatigue and fever.  HENT:  Negative for congestion and ear pain.   Respiratory:  Positive for cough and wheezing. Negative for chest tightness and shortness of breath.   Cardiovascular:  Negative for chest pain and palpitations.  Gastrointestinal:  Negative for abdominal pain.  Genitourinary:  Negative for dysuria, flank pain and frequency.  Musculoskeletal:  Negative for back pain and myalgias.  Skin:  Negative for rash.  Neurological:  Negative for dizziness, weakness and numbness.  Hematological:  Negative for adenopathy.  Psychiatric/Behavioral:  Negative for behavioral problems, hallucinations and suicidal ideas.     No past medical history on file.   Social  History   Socioeconomic History   Marital status: Single    Spouse name: Not on file   Number of children: Not on file   Years of education: Not on file   Highest education level: Not on file  Occupational History   Not on file  Tobacco Use   Smoking status: Every Day    Current packs/day: 1.00    Average packs/day: 1 pack/day for 15.0 years (15.0 ttl pk-yrs)    Types: Cigarettes   Smokeless tobacco: Never   Tobacco comments:    wants to try to quit   Vaping Use   Vaping status: Never Used  Substance and Sexual Activity   Alcohol use: Yes    Comment: drinks all not everyday   Drug use: No   Sexual activity: Yes  Other Topics Concern   Not on file  Social History Narrative   Not on file   Social Drivers of Health   Financial Resource Strain: Not on file  Food Insecurity: Not on file  Transportation Needs: Not on file  Physical Activity: Not on file  Stress: Not on file  Social Connections: Not on file  Intimate Partner Violence: Not on file    Past Surgical History:  Procedure Laterality Date   HERNIA REPAIR      Family History  Problem Relation Age of Onset   Stroke Mother    Diabetes Mother    Heart disease Father    Stroke Father  No Known Allergies  Current Outpatient Medications on File Prior to Visit  Medication Sig Dispense Refill   buPROPion  (WELLBUTRIN  XL) 150 MG 24 hr tablet TAKE 1 TABLET(150 MG) BY MOUTH DAILY 30 tablet 1   acetaminophen (TYLENOL) 500 MG tablet Take 500 mg by mouth every 6 (six) hours as needed. (Patient not taking: Reported on 12/07/2024)     benzonatate  (TESSALON ) 200 MG capsule Take 1 capsule (200 mg total) by mouth 2 (two) times daily as needed for cough. (Patient not taking: Reported on 12/07/2024) 20 capsule 0   tiotropium (SPIRIVA) 18 MCG inhalation capsule Place 18 mcg into inhaler and inhale daily. (Patient not taking: Reported on 12/07/2024)     Current Facility-Administered Medications on File Prior to Visit   Medication Dose Route Frequency Provider Last Rate Last Admin   albuterol  (PROVENTIL ) (2.5 MG/3ML) 0.083% nebulizer solution 2.5 mg  2.5 mg Nebulization Once Maranda, Sarah E, PA-C       ipratropium (ATROVENT ) nebulizer solution 0.5 mg  0.5 mg Nebulization Once Maranda Lauraine BRAVO, PA-C        BP 128/80   Pulse 88   Temp 97.9 F (36.6 C) (Oral)   Resp 15   Ht 5' 8 (1.727 m)   Wt 200 lb (90.7 kg)   SpO2 97%   BMI 30.41 kg/m         Objective:   Physical Exam   General Mental Status- Alert. General Appearance- Not in acute distress.   Skin General: Color- Normal Color. Moisture- Normal Moisture.  Neck  No JVD.  Chest and Lung Exam Auscultation: Breath Sounds:- CTA  Cardiovascular Auscultation:Rythm- RRR Murmurs & Other Heart Sounds:Auscultation of the heart reveals- No Murmurs.  Abdomen Inspection:-Inspeection Normal. Palpation/Percussion:Note:No mass. Palpation and Percussion of the abdomen reveal- Non Tender, Non Distended + BS, no rebound or guarding.Has umbilical hernia that protrudes on straight leg lift but reduces easily. Appears possibley ventral hernia as well.   Neurologic Cranial Nerve exam:- CN III-XII intact(No nystagmus), symmetric smile. Strength:- 5/5 equal and symmetric strength both upper and lower extremities.   Lower ext- calfs symmetric, negative homans signs. No edema bilaterally.      Assessment & Plan:   Patient Instructions  Umbilical hernia Suspected recurrence with bulging. Possible mesh failure. - Referred to general surgery for evaluation. - Advised wearing supportive belt during heavy lifting or exercise.  Chronic cough and wheezing Persistent cough and wheezing since recent illness. Previous azithromycin  and cough medications. Current albuterol  use. No recent chest imaging. - Ordered chest x-ray. - Refilled albuterol  inhaler for use as needed. - Refilled Spiriva inhaler for use as needed.  Nicotine dependence history,  cigarettes Recent cessation after 30 years. Motivated by health improvement. Wellbutrin  previously effective. - Prescribed Wellbutrin  XL 150 mg daily with two refills. - Encouraged continued abstinence.  General health maintenance Discussed insurance status and need for CT lung cancer screening due to smoking history. - Advised contacting insurance provider to reinstate coverage. - Plan for CT lung cancer screening once insurance is reinstated. - Plan for wellness exam and fasting labs in one month if insurance is reinstated. - Discuss potential for colonoscopy once insurance is reinstated. or can go ahead and place referral.  Follow up in one month or sooner if needed   I personally spent a total of 45 minutes in the care of the patient today including performing a medically appropriate exam/evaluation, counseling and educating, placing orders, and documenting clinical information in the EHR.

## 2025-01-07 ENCOUNTER — Ambulatory Visit: Payer: Self-pay | Admitting: Medical

## 2025-01-17 ENCOUNTER — Ambulatory Visit: Payer: Self-pay | Admitting: Family Medicine

## 2025-04-27 ENCOUNTER — Encounter: Payer: Self-pay | Admitting: Medical
